# Patient Record
Sex: Female | Born: 1995 | Race: Black or African American | Hispanic: No | Marital: Single | Smoking: Former smoker
Health system: Southern US, Community
[De-identification: ages and names within clinical notes are randomized; demographics above are authoritative.]

## PROBLEM LIST (undated history)

## (undated) DIAGNOSIS — J45909 Unspecified asthma, uncomplicated: Secondary | ICD-10-CM

---
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
## 2016-02-24 ENCOUNTER — Emergency Department (HOSPITAL_COMMUNITY): Payer: Self-pay

## 2016-02-24 ENCOUNTER — Emergency Department (HOSPITAL_COMMUNITY)
Admission: EM | Admit: 2016-02-24 | Discharge: 2016-02-24 | Disposition: A | Discharge status: Home / Self Care | Payer: Self-pay | Attending: Emergency Medicine | Admitting: Emergency Medicine

## 2016-02-24 ENCOUNTER — Encounter (HOSPITAL_COMMUNITY): Payer: Self-pay | Admitting: Emergency Medicine

## 2016-02-24 DIAGNOSIS — Y929 Unspecified place or not applicable: Secondary | ICD-10-CM | POA: Insufficient documentation

## 2016-02-24 DIAGNOSIS — W3400XA Accidental discharge from unspecified firearms or gun, initial encounter: Secondary | ICD-10-CM | POA: Insufficient documentation

## 2016-02-24 DIAGNOSIS — S71101A Unspecified open wound, right thigh, initial encounter: Secondary | ICD-10-CM | POA: Insufficient documentation

## 2016-02-24 DIAGNOSIS — R791 Abnormal coagulation profile: Secondary | ICD-10-CM | POA: Insufficient documentation

## 2016-02-24 DIAGNOSIS — Y939 Activity, unspecified: Secondary | ICD-10-CM | POA: Insufficient documentation

## 2016-02-24 DIAGNOSIS — S71131A Puncture wound without foreign body, right thigh, initial encounter: Secondary | ICD-10-CM

## 2016-02-24 DIAGNOSIS — Y999 Unspecified external cause status: Secondary | ICD-10-CM | POA: Insufficient documentation

## 2016-02-24 DIAGNOSIS — J45909 Unspecified asthma, uncomplicated: Secondary | ICD-10-CM | POA: Insufficient documentation

## 2016-02-24 DIAGNOSIS — Z23 Encounter for immunization: Secondary | ICD-10-CM | POA: Insufficient documentation

## 2016-02-24 HISTORY — DX: Unspecified asthma, uncomplicated: J45.909

## 2016-02-24 LAB — CBC
HCT: 40.7 % (ref 36.0–46.0)
Hemoglobin: 13.1 g/dL (ref 12.0–15.0)
MCH: 28.7 pg (ref 26.0–34.0)
MCHC: 32.2 g/dL (ref 30.0–36.0)
MCV: 89.1 fL (ref 78.0–100.0)
PLATELETS: 299 10*3/uL (ref 150–400)
RBC: 4.57 MIL/uL (ref 3.87–5.11)
RDW: 13.7 % (ref 11.5–15.5)
WBC: 8.5 10*3/uL (ref 4.0–10.5)

## 2016-02-24 LAB — COMPREHENSIVE METABOLIC PANEL
ALK PHOS: 63 U/L (ref 38–126)
ALT: 17 U/L (ref 14–54)
AST: 22 U/L (ref 15–41)
Albumin: 3.9 g/dL (ref 3.5–5.0)
Anion gap: 8 (ref 5–15)
BUN: 13 mg/dL (ref 6–20)
CALCIUM: 9.1 mg/dL (ref 8.9–10.3)
CO2: 20 mmol/L — AB (ref 22–32)
CREATININE: 0.99 mg/dL (ref 0.44–1.00)
Chloride: 110 mmol/L (ref 101–111)
Glucose, Bld: 103 mg/dL — ABNORMAL HIGH (ref 65–99)
Potassium: 3.2 mmol/L — ABNORMAL LOW (ref 3.5–5.1)
Sodium: 138 mmol/L (ref 135–145)
Total Bilirubin: 0.3 mg/dL (ref 0.3–1.2)
Total Protein: 7.7 g/dL (ref 6.5–8.1)

## 2016-02-24 LAB — PREPARE FRESH FROZEN PLASMA
UNIT DIVISION: 0
Unit division: 0

## 2016-02-24 LAB — URINALYSIS, ROUTINE W REFLEX MICROSCOPIC
Bilirubin Urine: NEGATIVE
GLUCOSE, UA: NEGATIVE mg/dL
KETONES UR: NEGATIVE mg/dL
Nitrite: NEGATIVE
PH: 6 (ref 5.0–8.0)
Protein, ur: NEGATIVE mg/dL
SPECIFIC GRAVITY, URINE: 1.005 (ref 1.005–1.030)

## 2016-02-24 LAB — ETHANOL: ALCOHOL ETHYL (B): 133 mg/dL — AB (ref <5)

## 2016-02-24 LAB — TYPE AND SCREEN
BLOOD PRODUCT EXPIRATION DATE: 201802132359
Blood Product Expiration Date: 201802162359
ISSUE DATE / TIME: 201801280517
ISSUE DATE / TIME: 201801280517
UNIT TYPE AND RH: 9500
Unit Type and Rh: 9500

## 2016-02-24 LAB — I-STAT CHEM 8, ED
BUN: 14 mg/dL (ref 6–20)
CHLORIDE: 108 mmol/L (ref 101–111)
CREATININE: 1.2 mg/dL — AB (ref 0.44–1.00)
Calcium, Ion: 1.14 mmol/L — ABNORMAL LOW (ref 1.15–1.40)
GLUCOSE: 104 mg/dL — AB (ref 65–99)
HCT: 42 % (ref 36.0–46.0)
Hemoglobin: 14.3 g/dL (ref 12.0–15.0)
POTASSIUM: 3.3 mmol/L — AB (ref 3.5–5.1)
Sodium: 142 mmol/L (ref 135–145)
TCO2: 22 mmol/L (ref 0–100)

## 2016-02-24 LAB — ABO/RH: ABO/RH(D): B POS

## 2016-02-24 LAB — APTT: aPTT: 29 seconds (ref 24–36)

## 2016-02-24 LAB — CDS SEROLOGY

## 2016-02-24 LAB — I-STAT BETA HCG BLOOD, ED (MC, WL, AP ONLY): I-stat hCG, quantitative: <5 m[IU]/mL (ref <5)

## 2016-02-24 MED ORDER — HYDROCODONE-ACETAMINOPHEN 5-325 MG PO TABS
1.0000 | ORAL_TABLET | Freq: Once | ORAL | Status: AC
  Administered 2016-02-24: 1 via ORAL
  Filled 2016-02-24: qty 1

## 2016-02-24 MED ORDER — TETANUS-DIPHTH-ACELL PERTUSSIS 5-2.5-18.5 LF-MCG/0.5 IM SUSP
INTRAMUSCULAR | Status: AC
  Filled 2016-02-24: qty 0.5

## 2016-02-24 MED ORDER — SODIUM CHLORIDE 0.9 % IV SOLN
INTRAVENOUS | Status: AC | PRN
  Administered 2016-02-24: 1000 mL via INTRAVENOUS

## 2016-02-24 MED ORDER — TETANUS-DIPHTH-ACELL PERTUSSIS 5-2.5-18.5 LF-MCG/0.5 IM SUSP
0.5000 mL | Freq: Once | INTRAMUSCULAR | Status: AC
  Administered 2016-02-24: 0.5 mL via INTRAMUSCULAR

## 2016-02-24 MED ORDER — HYDROCODONE-ACETAMINOPHEN 5-325 MG PO TABS: 1.0000 | ORAL_TABLET | Freq: Four times a day (QID) | ORAL | 0 refills | Status: DC | PRN
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
## 2016-02-24 NOTE — ED Notes (Signed)
Pt ambulated without difficulty
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
## 2016-02-25 ENCOUNTER — Encounter: Payer: Self-pay | Admitting: Emergency Medicine

## 2016-03-03 ENCOUNTER — Telehealth (HOSPITAL_COMMUNITY): Payer: Self-pay

## 2016-03-04 NOTE — Telephone Encounter (Signed)
appointment scheduled for 03/06/16 at 10:30 AM.   Jill Scott, [facility] Pager: [phone]

## 2017-03-10 LAB — HM PAP SMEAR

## 2018-02-11 LAB — HM HIV SCREENING LAB: HM HIV Screening: NEGATIVE
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
## 2018-08-30 ENCOUNTER — Ambulatory Visit: Payer: Self-pay | Admitting: Nurse Practitioner

## 2018-08-30 ENCOUNTER — Ambulatory Visit: Payer: Self-pay

## 2018-08-30 ENCOUNTER — Other Ambulatory Visit: Payer: Self-pay

## 2018-08-30 DIAGNOSIS — Z113 Encounter for screening for infections with a predominantly sexual mode of transmission: Secondary | ICD-10-CM

## 2018-08-30 LAB — WET PREP FOR TRICH, YEAST, CLUE
Trichomonas Exam: NEGATIVE
Yeast Exam: NEGATIVE

## 2018-08-30 NOTE — Progress Notes (Signed)
Here today for STD screening. Declines bloodwork.  Hal Morales, RN  Wet prep results reviewed. No treatment indicated per standing orders. Hal Morales, RN

## 2018-09-13 ENCOUNTER — Encounter: Payer: Self-pay | Admitting: Nurse Practitioner

## 2018-09-13 NOTE — Progress Notes (Signed)
    STI clinic/screening visit  Subjective:  Jill Scott is a 23 y.o. female being seen today for an STI screening visit. The patient reports they do have symptoms.  Patient has the following medical conditions:  There are no active problems to display for this patient.    Chief Complaint  Patient presents with  . SEXUALLY TRANSMITTED DISEASE    Here for STD testing   Patient reports - desire for STD testing  See flowsheet for further details and programmatic requirements.    The following portions of the patient's history were reviewed and updated as appropriate: allergies, current medications, past medical history, past social history, past surgical history and problem list.  Objective:  There were no vitals filed for this visit.  Physical Exam Vitals signs and nursing note reviewed.  Constitutional:      Appearance: Normal appearance.  HENT:     Head: Normocephalic and atraumatic.     Mouth/Throat:     Mouth: Mucous membranes are moist.     Pharynx: Oropharynx is clear. No oropharyngeal exudate or posterior oropharyngeal erythema.  Pulmonary:     Effort: Pulmonary effort is normal.  Abdominal:     General: Abdomen is flat.     Palpations: There is no mass.     Tenderness: There is no abdominal tenderness. There is no rebound.  Genitourinary:    General: Normal vulva.     Exam position: Lithotomy position.     Pubic Area: No rash or pubic lice.      Labia:        Right: No rash or lesion.        Left: No rash or lesion.      Vagina: Normal. No vaginal discharge, erythema, bleeding or lesions.     Cervix: No cervical motion tenderness, discharge, friability, lesion or erythema.     Uterus: Normal.      Adnexa: Right adnexa normal and left adnexa normal.     Rectum: Normal.  Lymphadenopathy:     Head:     Right side of head: No preauricular or posterior auricular adenopathy.     Left side of head: No preauricular or posterior auricular adenopathy.   Cervical: No cervical adenopathy.     Upper Body:     Right upper body: No supraclavicular or axillary adenopathy.     Left upper body: No supraclavicular or axillary adenopathy.     Lower Body: No right inguinal adenopathy. No left inguinal adenopathy.  Skin:    General: Skin is warm and dry.     Findings: No rash.  Neurological:     Mental Status: She is alert and oriented to person, place, and time.       Assessment and Plan:  Jill Scott is a 23 y.o. female presenting to the [facility] Department for STI screening  1. Screening examination for STD (sexually transmitted disease) Await test results  - WET PREP FOR [place], YEAST, CLUE - please treat wet mount per standing odor - Chlamydia/Gonococcus/Trichomonas, NAA  Client verbalizes understanding and is in agreement with plan of care    No follow-ups on file.  No future appointments.  Berniece Andreas, NP

## 2019-12-09 ENCOUNTER — Ambulatory Visit: Payer: Self-pay | Admitting: Physician Assistant

## 2019-12-09 ENCOUNTER — Ambulatory Visit: Payer: Self-pay

## 2019-12-09 ENCOUNTER — Other Ambulatory Visit: Payer: Self-pay

## 2019-12-09 DIAGNOSIS — N760 Acute vaginitis: Secondary | ICD-10-CM

## 2019-12-09 DIAGNOSIS — B9689 Other specified bacterial agents as the cause of diseases classified elsewhere: Secondary | ICD-10-CM

## 2019-12-09 DIAGNOSIS — Z113 Encounter for screening for infections with a predominantly sexual mode of transmission: Secondary | ICD-10-CM

## 2019-12-09 LAB — WET PREP FOR TRICH, YEAST, CLUE
Trichomonas Exam: NEGATIVE
Yeast Exam: NEGATIVE

## 2019-12-09 MED ORDER — METRONIDAZOLE 500 MG PO TABS: 500.0000 mg | ORAL_TABLET | Freq: Two times a day (BID) | ORAL | 0 refills | Status: DC

## 2019-12-09 NOTE — Progress Notes (Signed)
Wet mount reviewed with provider and pt treated for BV per standing order and per provider order. Provider orders completed.

## 2019-12-11 ENCOUNTER — Encounter: Payer: Self-pay | Admitting: Physician Assistant

## 2019-12-11 NOTE — Progress Notes (Signed)
  [facility] Department STI clinic/screening visit  Subjective:  Jill Scott is a 24 y.o. female being seen today for an STI screening visit. The patient reports they do have symptoms.  Patient reports that they do not desire a pregnancy in the next year.   They reported they are not interested in discussing contraception today.  Patient's last menstrual period was 11/14/2019.   Patient has the following medical conditions:  There are no problems to display for this patient.   Chief Complaint  Patient presents with  . SEXUALLY TRANSMITTED DISEASE    screening    HPI  Patient reports that she has had a vaginal odor for 1-2 weeks.  Denies other symptoms, surgeries and regular medicines.  States that she has not had a HIV test and last pap was  In 2019.   See flowsheet for further details and programmatic requirements.    The following portions of the patient's history were reviewed and updated as appropriate: allergies, current medications, past medical history, past social history, past surgical history and problem list.  Objective:  There were no vitals filed for this visit.  Physical Exam Constitutional:      General: She is not in acute distress.    Appearance: Normal appearance.  HENT:     Head: Normocephalic and atraumatic.  Eyes:     Conjunctiva/sclera: Conjunctivae normal.  Pulmonary:     Effort: Pulmonary effort is normal.  Abdominal:     Tenderness: There is no abdominal tenderness.  Skin:    General: Skin is warm and dry.     Findings: No bruising, erythema, lesion or rash.  Neurological:     Mental Status: She is alert and oriented to person, place, and time.  Psychiatric:        Mood and Affect: Mood normal.        Behavior: Behavior normal.        Thought Content: Thought content normal.        Judgment: Judgment normal.      Assessment and Plan:  Jill Scott is a 24 y.o. female presenting to the [facility] Department  for STI screening  1. Screening for STD (sexually transmitted disease) Patient into clinic with symptoms. Patient declines blood work today.  Patient opts to self-collect vaginal samples for testing today.  Counseled how to collect for accurate results.  Rec condoms with all sex. Await test results.  Counseled that RN will call if needs to RTC for treatment once results are back. - WET PREP FOR TRICH, YEAST, CLUE - Chlamydia/Gonorrhea [facility]  2. BV (bacterial vaginosis) Will treat for BV with Metronidazole 500 mg #14 1 po BID for 7 days with food, no EtOH for 24 hr before and until 72 hr after completing medicine. No sex for 10 days. Enc to use OTC antifungal cream if has itching during or just after completing antibiotics. - metroNIDAZOLE (FLAGYL) 500 MG tablet; Take 1 tablet (500 mg total) by mouth 2 (two) times daily.  Dispense: 14 tablet; Refill: 0     Return if symptoms worsen or fail to improve.  No future appointments.  Matt Holmes, PA

## 2020-07-02 ENCOUNTER — Other Ambulatory Visit: Payer: Self-pay

## 2020-07-02 ENCOUNTER — Ambulatory Visit: Payer: Self-pay | Admitting: Family Medicine

## 2020-07-02 ENCOUNTER — Encounter: Payer: Self-pay | Admitting: Family Medicine

## 2020-07-02 DIAGNOSIS — Z113 Encounter for screening for infections with a predominantly sexual mode of transmission: Secondary | ICD-10-CM

## 2020-07-02 DIAGNOSIS — N760 Acute vaginitis: Secondary | ICD-10-CM

## 2020-07-02 DIAGNOSIS — B9689 Other specified bacterial agents as the cause of diseases classified elsewhere: Secondary | ICD-10-CM

## 2020-07-02 LAB — WET PREP FOR TRICH, YEAST, CLUE
Trichomonas Exam: NEGATIVE
Yeast Exam: NEGATIVE

## 2020-07-02 MED ORDER — METRONIDAZOLE 500 MG PO TABS: 500.0000 mg | ORAL_TABLET | Freq: Two times a day (BID) | ORAL | 0 refills | Status: AC

## 2020-07-02 NOTE — Progress Notes (Signed)
Pt here for STD screening.  Wet mount result reviewed with Provider.  Medication dispensed per Provider orders.  Condoms declined.  Berdie Ogren, RN

## 2020-07-02 NOTE — Progress Notes (Signed)
[facility] Department STI clinic/screening visit  Subjective:  Jill Scott is a 25 y.o. female being seen today for an STI screening visit. The patient reports they do not have symptoms.  Patient reports that they do not desire a pregnancy in the next year.   They reported they are not interested in discussing contraception today.  Patient's last menstrual period was 06/16/2020.   Patient has the following medical conditions:  There are no problems to display for this patient.   Chief Complaint  Patient presents with  . SEXUALLY TRANSMITTED DISEASE    screening    HPI  Patient reports here for screening, denies symptoms   Last HIV test per patient/review of record was 2020 Patient reports last pap was 2019  See flowsheet for further details and programmatic requirements.    The following portions of the patient's history were reviewed and updated as appropriate: allergies, current medications, past medical history, past social history, past surgical history and problem list.  Objective:  There were no vitals filed for this visit.  Physical Exam Vitals and nursing note reviewed.  Constitutional:      Appearance: Normal appearance.  HENT:     Head: Normocephalic and atraumatic.     Mouth/Throat:     Mouth: Mucous membranes are moist.     Pharynx: Oropharynx is clear. No oropharyngeal exudate or posterior oropharyngeal erythema.  Pulmonary:     Effort: Pulmonary effort is normal.  Chest:  Breasts:     Right: No axillary adenopathy or supraclavicular adenopathy.     Left: No axillary adenopathy or supraclavicular adenopathy.    Abdominal:     General: Abdomen is flat.     Palpations: There is no mass.     Tenderness: There is no abdominal tenderness. There is no rebound.  Genitourinary:    Exam position: Lithotomy position.     Pubic Area: No rash or pubic lice.      Labia:        Right: No rash or lesion.        Left: No rash or lesion.       Vagina: Normal. No vaginal discharge, erythema, bleeding or lesions.     Cervix: No cervical motion tenderness, discharge, friability, lesion or erythema.     Uterus: Normal.      Adnexa: Right adnexa normal and left adnexa normal.     Comments: Pt self collected  Lymphadenopathy:     Head:     Right side of head: No preauricular or posterior auricular adenopathy.     Left side of head: No preauricular or posterior auricular adenopathy.     Cervical: No cervical adenopathy.     Upper Body:     Right upper body: No supraclavicular or axillary adenopathy.     Left upper body: No supraclavicular or axillary adenopathy.     Lower Body: No right inguinal adenopathy. No left inguinal adenopathy.  Skin:    General: Skin is warm and dry.     Findings: No rash.  Neurological:     Mental Status: She is alert and oriented to person, place, and time.  Psychiatric:        Behavior: Behavior normal.      Assessment and Plan:  Jill Scott is a 25 y.o. female presenting to the [facility] Department for STI screening  1. Screening examination for venereal disease - Gonococcus culture - Chlamydia/Gonorrhea [place] [facility] - WET PREP FOR TRICH, YEAST,  CLUE   1. Screening examination for venereal disease  - Gonococcus culture - Chlamydia/Gonorrhea [place] [facility] - WET PREP FOR TRICH, YEAST, CLUE  2. BV (bacterial vaginosis) - metroNIDAZOLE (FLAGYL) 500 MG tablet; Take 1 tablet (500 mg total) by mouth 2 (two) times daily for 7 days.  Dispense: 14 tablet; Refill: 0  Patient accepted all screenings including oral GC, wet prep, vaginal CT/GC and declined bloodwork for HIV/RPR.  Patient meets criteria for HepB screening? No. Ordered? No - does not meet criteria  Patient meets criteria for HepC screening? No. Ordered? No - does not meet criteria   Wet prep results + amine & + clue  Treatment needed for BV with Metronidazole 500 mg PO x 7 days.  Discussed time line for State Lab  results and that patient will be called with positive results and encouraged patient to call if she had not heard in 2 weeks.  Counseled to return or seek care for continued or worsening symptoms Recommended condom use with all sex  Patient is currently using no method  to prevent pregnancy.      Return for as needed.  No future appointments.  Wendi Snipes, FNP

## 2020-07-06 LAB — GONOCOCCUS CULTURE

## 2021-03-04 ENCOUNTER — Other Ambulatory Visit: Payer: Self-pay

## 2021-03-04 ENCOUNTER — Ambulatory Visit (LOCAL_COMMUNITY_HEALTH_CENTER): Payer: Self-pay | Admitting: Nurse Practitioner

## 2021-03-04 ENCOUNTER — Encounter: Payer: Self-pay | Admitting: Nurse Practitioner

## 2021-03-04 VITALS — BP 132/89 | Ht 64.0 in | Wt 200.8 lb

## 2021-03-04 DIAGNOSIS — Z01419 Encounter for gynecological examination (general) (routine) without abnormal findings: Secondary | ICD-10-CM

## 2021-03-04 DIAGNOSIS — Z3009 Encounter for other general counseling and advice on contraception: Secondary | ICD-10-CM

## 2021-03-04 LAB — WET PREP FOR TRICH, YEAST, CLUE
Trichomonas Exam: NEGATIVE
Yeast Exam: NEGATIVE

## 2021-03-04 NOTE — Progress Notes (Signed)
[facility] Number: [phone]    Family Planning Visit- Initial Visit  Subjective:  Jill Scott is a 26 y.o.  G2P0020   being seen today for an initial annual visit and to discuss reproductive life planning.  The patient is currently using No Method - Other Reason for pregnancy prevention. Patient reports   does not want a pregnancy in the next year.  Patient has the following medical conditions does not have a problem list on file.  Chief Complaint  Patient presents with   Annual Exam    Patient reports to clinic today for a physical and STD screening   Patient denies any current signs and symptoms.   Body mass index is 34.47 kg/m. - Patient is eligible for diabetes screening based on BMI and age [id number]?  not applicable [id number] ordered? not applicable  Patient reports 2  partner/s in last year. Desires STI screening?  Yes  Has patient been screened once for HCV in the past?  No  No results found for: HCVAB  Does the patient have current drug use (including MJ), have a partner with drug use, and/or has been incarcerated since last result? No  If yes-- Screen for HCV through [facility]   Does the patient meet criteria for HBV testing? No  Criteria:  -Household, sexual or needle sharing contact with HBV -History of drug use -HIV positive -Those with known Hep C   Health Maintenance Due  Topic Date Due   COVID-19 Vaccine (1) Never done   HPV VACCINES (1 - 2-dose series) Never done   Hepatitis C Screening  Never done   PAP SMEAR-Modifier  03/10/2018   PAP-Cervical Cytology Screening  03/10/2020   INFLUENZA VACCINE  Never done    ROS  The following portions of the patient's history were reviewed and updated as appropriate: allergies, current medications, past family history, past medical history, past social history, past surgical history and problem list. Problem list updated.   See  flowsheet for other program required questions.  Objective:   Vitals:   03/04/21 1353  BP: 132/89  Weight: 200 lb 12.8 oz (91.1 kg)  Height: 5\' 4"  (1.626 m)    Physical Exam Constitutional:      Appearance: Normal appearance.  HENT:     Head: Normocephalic.     Right Ear: Tympanic membrane normal.     Left Ear: Tympanic membrane normal.     Nose: Nose normal.     Mouth/Throat:     Lips: Pink.     Comments: No visible signs of dental caries.  Last dental exam 1 year ago.  Eyes:     Pupils: Pupils are equal, round, and reactive to light.  Cardiovascular:     Rate and Rhythm: Normal rate and regular rhythm.  Pulmonary:     Effort: Pulmonary effort is normal.     Breath sounds: Normal breath sounds.  Chest:     Comments: Breasts:        Right: Normal. No swelling, mass, nipple discharge, skin change or tenderness.        Left: Normal. No swelling, mass, nipple discharge, skin change or tenderness.   Abdominal:     General: Abdomen is flat. Bowel sounds are normal.     Palpations: Abdomen is soft.  Genitourinary:    Comments: External genitalia/pubic area without nits, lice, edema, erythema, lesions and inguinal adenopathy. Vagina with normal mucosa and discharge.  Cervix without visible lesions. Uterus firm, mobile, nt, no masses, no CMT, no adnexal tenderness or fullness. pH >4.5. Musculoskeletal:     Cervical back: Full passive range of motion without pain, normal range of motion and neck supple.  Skin:    General: Skin is warm and dry.  Neurological:     Mental Status: She is alert and oriented to person, place, and time.  Psychiatric:        Attention and Perception: Attention normal.        Mood and Affect: Mood normal.        Speech: Speech normal.        Behavior: Behavior is cooperative.      Assessment and Plan:  Jill Scott is a 26 y.o. female presenting to the [facility] Department for an initial annual wellness/contraceptive  visit  Contraception counseling: Reviewed all forms of birth control options in the tiered based approach. available including abstinence; over the counter/barrier methods; hormonal contraceptive medication including pill, patch, ring, injection,contraceptive implant, ECP; hormonal and nonhormonal IUDs; permanent sterilization options including vasectomy and the various tubal sterilization modalities. Risks, benefits, and typical effectiveness rates were reviewed.  Questions were answered.  Written information was also given to the patient to review.  Patient desires No Method - Other Reason, this was prescribed for patient.    The patient will follow up in  1 year for surveillance.  The patient was told to call with any further questions, or with any concerns about this method of contraception.  Emphasized use of condoms 100% of the time for STI prevention.  Patient was offered ECP based on last LMP.   1. Family planning counseling -26 year old female in clinic today for a physical and STD screening.  -Accepts STD screening today.  Declines birth control methods today.   -Patient accepted all screenings including vaginal CT/GC and denies bloodwork for HIV/RPR.  Patient meets criteria for HepB screening? No. Ordered? No - patient declines  Patient meets criteria for HepC screening? No. Ordered? No - patient declines   Treat wet prep per standing order Discussed time line for State Lab results and that patient will be called with positive results and encouraged patient to call if she had not heard in 2 weeks.  Counseled to return or seek care for continued or worsening symptoms Recommended condom use with all sex  Patient is currently using  no contraception  to prevent pregnancy.    - [place], YEAST, CLUE  2. Well woman exam with routine gynecological exam -Well woman exam performed today.   -PAP performed today. -CBE today, next due  02/2024 - IGP, rfx Aptima HPV ASCU      Return in about 1 year (around 03/04/2022).  No future appointments.  Gregary Cromer, FNP

## 2021-03-04 NOTE — Progress Notes (Signed)
Pt here for PE and STD check.  Wet mount results reviewed, no treatment required per SO.  Porschea Borys M Egypt Marchiano, RN  

## 2021-03-07 LAB — IGP, RFX APTIMA HPV ASCU: PAP Smear Comment: 0

## 2022-08-01 ENCOUNTER — Ambulatory Visit: Payer: 59

## 2022-08-07 ENCOUNTER — Encounter: Payer: Self-pay | Admitting: Physician Assistant

## 2022-08-07 ENCOUNTER — Ambulatory Visit: Payer: 59 | Admitting: Physician Assistant

## 2022-08-07 VITALS — BP 122/84 | HR 63 | Temp 98.4°F | Ht 64.0 in | Wt 189.6 lb

## 2022-08-07 DIAGNOSIS — Z01419 Encounter for gynecological examination (general) (routine) without abnormal findings: Secondary | ICD-10-CM | POA: Diagnosis not present

## 2022-08-07 DIAGNOSIS — Z3009 Encounter for other general counseling and advice on contraception: Secondary | ICD-10-CM | POA: Diagnosis not present

## 2022-08-07 DIAGNOSIS — A599 Trichomoniasis, unspecified: Secondary | ICD-10-CM

## 2022-08-07 LAB — WET PREP FOR TRICH, YEAST, CLUE
Trichomonas Exam: POSITIVE — AB
Yeast Exam: NEGATIVE

## 2022-08-07 MED ORDER — METRONIDAZOLE 500 MG PO TABS: 500.0000 mg | ORAL_TABLET | Freq: Two times a day (BID) | ORAL | 0 refills | Status: AC

## 2022-08-07 NOTE — Progress Notes (Signed)
Marked as arrive at 1445, but still with clerical when called to clinic.Jossie Ng, RN Wet prep reviewed and treated for trich per standing order. Jossie Ng, RN

## 2022-08-07 NOTE — Progress Notes (Signed)
[facility] DEPARTMENT [facility] [address] Main Number: [phone]   Family Planning Visit- Initial Visit  Subjective:  Jill Scott is a 27 y.o.  G2P0020   being seen today for an initial annual visit and to discuss reproductive life planning.  The patient is currently using Female Condom for pregnancy prevention. Patient reports   does not want a pregnancy in the next year.     report they are looking for a method that provides Ready when they are   Patient has the following medical conditions does not have a problem list on file.  Chief Complaint  Patient presents with   Annual Exam    Patient reports she had some recent burning with urination, but this has resolved. Report she is expecting her menses to start today.  Patient denies other concerns today.   Body mass index is 32.54 kg/m. - Patient is eligible for diabetes screening based on BMI> 25 and age >35?  not applicable HA1C ordered? no  Patient reports 1  partner/s in last year. Desires STI screening?  Yes  Has patient been screened once for HCV in the past?  No  No results found for: "HCVAB"  Does the patient have current drug use (including MJ), have a partner with drug use, and/or has been incarcerated since last result? No  If yes-- Screen for HCV through [facility]   Does the patient meet criteria for HBV testing? No  Criteria:  -Household, sexual or needle sharing contact with HBV -History of drug use -HIV positive -Those with known Hep C   Health Maintenance Due  Topic Date Due   HPV VACCINES (1 - 3-dose series) Never done   Hepatitis C Screening  Never done   COVID-19 Vaccine (1 - 2023-24 season) Never done   PAP SMEAR-Modifier  03/04/2022    Review of Systems  Constitutional: Negative.   HENT: Negative.    Eyes: Negative.   Respiratory: Negative.    Cardiovascular: Negative.   Gastrointestinal: Negative.   Genitourinary: Negative.    Musculoskeletal: Negative.   Skin: Negative.   Neurological: Negative.   Endo/Heme/Allergies: Negative.   Psychiatric/Behavioral: Negative.      The following portions of the patient's history were reviewed and updated as appropriate: allergies, current medications, past family history, past medical history, past social history, past surgical history and problem list. Problem list updated.   See flowsheet for other program required questions.  Objective:   Vitals:   08/07/22 1506  BP: 122/84  Pulse: 63  Temp: 98.4 F (36.9 C)  Weight: 189 lb 9.6 oz (86 kg)  Height: 5\' 4"  (1.626 m)    Physical Exam Constitutional:      Appearance: Normal appearance.  HENT:     Head: Normocephalic and atraumatic.  Cardiovascular:     Rate and Rhythm: Normal rate and regular rhythm.     Heart sounds: Normal heart sounds.  Pulmonary:     Effort: Pulmonary effort is normal.  Abdominal:     Palpations: Abdomen is soft.  Genitourinary:    General: Normal vulva.     Exam position: Lithotomy position.     Pubic Area: No rash.      Labia:        Right: No rash or lesion.        Left: No rash or lesion.      Urethra: No urethral pain or urethral lesion.     Vagina: No vaginal  discharge, bleeding or lesions.     Cervix: Cervical bleeding present. No cervical motion tenderness or friability.     Uterus: Not tender.      Adnexa:        Right: No tenderness.         Left: No tenderness.       Rectum: No external hemorrhoid.     Comments: Scant bright blood at cervial os. Musculoskeletal:        General: Normal range of motion.  Lymphadenopathy:     Lower Body: No right inguinal adenopathy. No left inguinal adenopathy.  Skin:    General: Skin is warm and dry.  Neurological:     General: No focal deficit present.     Mental Status: She is alert.  Psychiatric:        Mood and Affect: Mood normal.        Behavior: Behavior normal.     Assessment and Plan:  Jill Scott is a 27  y.o. female presenting to the [facility] Department for an initial annual wellness/contraceptive visit  Contraception counseling: Reviewed options based on patient desire and reproductive life plan. Patient is interested in Female Condom. This was provided to the patient today.   Risks, benefits, and typical effectiveness rates were reviewed.  Questions were answered.  Written information was also given to the patient to review.    The patient will follow up in  1 years for surveillance.  The patient was told to call with any further questions, or with any concerns about this method of contraception.  Emphasized use of condoms 100% of the time for STI prevention.  Educated on ECP and assessed for need of ECP. Patient reported > 120 hours .  Reviewed options and patient desired No method of ECP, declined all    1. Family planning counseling Continue condoms. Pt declines more statistically reliable BCM at this time.   2. Well woman exam with routine gynecological exam Declines HIV, syphilis, Hepatitis bloodwork. Treat wet prep findings per S.O. Enc to establish PCP and dental home for routine and urgent care.  - Chlamydia/Gonorrhea [place] [facility] - WET PREP FOR TRICH, YEAST, CLUE   Return in about 1 year (around 08/07/2023) for Annual well-woman exam.  No future appointments.  Landry Dyke, PA-C

## 2023-05-18 ENCOUNTER — Ambulatory Visit: Payer: Self-pay | Admitting: Nurse Practitioner

## 2023-05-18 ENCOUNTER — Encounter: Payer: Self-pay | Admitting: Nurse Practitioner

## 2023-05-18 DIAGNOSIS — Z113 Encounter for screening for infections with a predominantly sexual mode of transmission: Secondary | ICD-10-CM

## 2023-05-18 LAB — WET PREP FOR TRICH, YEAST, CLUE
Trichomonas Exam: NEGATIVE
Yeast Exam: NEGATIVE

## 2023-05-18 NOTE — Progress Notes (Unsigned)
 [facility] Department STI clinic [address] [place] [address] Main phone: [phone]  STI screening visit  Subjective:  Jill Scott is a 28 y.o. female being seen today for an STI screening visit. The patient reports they do not have symptoms.  Patient reports that they do not desire a pregnancy in the next year.   They reported they are not interested in discussing contraception today.    Patient's last menstrual period was 05/06/2023 (exact date).  Patient has the following medical conditions:  There are no active problems to display for this patient.  Chief Complaint  Patient presents with   SEXUALLY TRANSMITTED DISEASE    STI screening-No symptoms   Patient is a pleasant 28 y.o. female who presents to the office today requesting asymptomatic STI testing. Patient indicates 1 female partner in the last 2 months. She reports practicing vaginal and oral sex and uses condoms sometimes. Patient indicates an STI history of trich and chlamydia. Patient reports last sex was 05/04/23 with a condom. She indicates no use of a contraception method other than condoms sometimes.  Patient indicates LMP was 05/06/23 and she has monthly periods.    Last HIV test per patient/review of record was  Lab Results  Component Value Date   HMHIVSCREEN Negative - Validated 02/11/2018    Last HEPC test per patient/review of record was No results found for: "HMHEPCSCREEN" No components found for: "HEPC"   Last HEPB test per patient/review of record was No components found for: "HMHEPBSCREEN"   Patient reports last pap was:    Lab Results  Component Value Date   SPECADGYN Comment 03/04/2021   Result Date Procedure Results Follow-ups  03/04/2021 IGP, rfx Aptima HPV ASCU DIAGNOSIS:: Comment Specimen adequacy:: Comment Clinician Provided ICD10: Comment Performed by:: Comment PAP Smear Comment: . Note:: Comment Test Methodology: Comment PAP Reflex: Comment    03/10/2017 HM PAP SMEAR HM Pap smear: ASCUS     Screening for MPX risk: Does the patient have an unexplained rash? No Is the patient MSM? No Does the patient endorse multiple sex partners or anonymous sex partners? No Did the patient have close or sexual contact with a person diagnosed with MPX? No Has the patient traveled outside the [place]  where MPX is endemic? No Is there a high clinical suspicion for MPX-- evidenced by one of the following No  -Unlikely to be chickenpox  -Lymphadenopathy  -Rash that present in same phase of evolution on any given body part See flowsheet for further details and programmatic requirements.   Immunization history:  Immunization History  Administered Date(s) Administered   Tdap 02/24/2016     The following portions of the patient's history were reviewed and updated as appropriate: allergies, current medications, past medical history, past social history, past surgical history and problem list.  Objective:  There were no vitals filed for this visit.  Physical Exam Nursing note reviewed.  Constitutional:      Appearance: Normal appearance.  HENT:     Head: Normocephalic.     Salivary Glands: Right salivary gland is not diffusely enlarged or tender. Left salivary gland is not diffusely enlarged or tender.     Mouth/Throat:     Lips: Pink. No lesions.     Mouth: Mucous membranes are moist.     Tongue: No lesions. Tongue does not deviate from midline.     Pharynx: Oropharynx is clear. Uvula midline. No oropharyngeal exudate or posterior oropharyngeal erythema.  Tonsils: No tonsillar exudate.  Eyes:     General:        Right eye: No discharge.        Left eye: No discharge.  Pulmonary:     Effort: Pulmonary effort is normal.  Genitourinary:    Comments: Patient asymptomatic. Declines genital exam. Self-swabbing.  Lymphadenopathy:     Head:     Right side of head: No submental, submandibular, tonsillar, preauricular or posterior auricular  adenopathy.     Left side of head: No submental, submandibular, tonsillar, preauricular or posterior auricular adenopathy.     Cervical: No cervical adenopathy.     Right cervical: No superficial or posterior cervical adenopathy.    Left cervical: No superficial or posterior cervical adenopathy.     Upper Body:     Right upper body: No supraclavicular or axillary adenopathy.     Left upper body: No supraclavicular or axillary adenopathy.  Skin:    General: Skin is warm and dry.     Comments: Skin tone appropriate for ethnicity. Assessed exposed areas only and back.   Neurological:     Mental Status: She is alert and oriented to person, place, and time.  Psychiatric:        Attention and Perception: Attention and perception normal.        Mood and Affect: Mood and affect normal.        Speech: Speech normal.        Behavior: Behavior normal. Behavior is cooperative.        Thought Content: Thought content normal.     Assessment and Plan:  Jill Scott is a 28 y.o. female presenting to the [facility] Department for STI screening  1. Screening for venereal disease (Primary)  - Chlamydia/Gonorrhea [place] [facility] - WET PREP FOR TRICH, YEAST, CLUE - Gonococcus culture  Patient accepted all screenings including oral, vaginal CT/GC and wet prep. Patient meets criteria for HepB screening? No. Ordered? not applicable Patient meets criteria for HepC screening? No. Ordered? not applicable  Treat wet prep per standing order Discussed time line for State Lab results and that patient will be called with positive results and encouraged patient to call if she had not heard in 2 weeks.  Counseled to return or seek care for continued or worsening symptoms Recommended repeat testing in 3 months with positive results. Recommended condom use with all sex for STI prevention.   Patient is currently using  female condoms sometimes  to prevent pregnancy.    Return if symptoms worsen or  fail to improve.  No future appointments.  Total time with patient 15 minutes.   Merleen Stare, NP

## 2023-05-18 NOTE — Progress Notes (Unsigned)
 Pt is here for STD screening , Wet prep results reviewed with pt, no treatment required per standing order. Condoms declined. Sonda Primes, RN.

## 2023-05-23 LAB — GONOCOCCUS CULTURE
